# Patient Record
Sex: Female | Born: 2000 | Race: Black or African American | Hispanic: No | Marital: Single | Smoking: Never smoker
Health system: Southern US, Community
[De-identification: ages and names within clinical notes are randomized; demographics above are authoritative.]

## PROBLEM LIST (undated history)

## (undated) DIAGNOSIS — R519 Headache, unspecified: Secondary | ICD-10-CM

## (undated) DIAGNOSIS — R51 Headache: Secondary | ICD-10-CM

## (undated) HISTORY — DX: Headache, unspecified: R51.9

## (undated) HISTORY — DX: Headache: R51

---
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
## 2021-03-11 ENCOUNTER — Emergency Department (HOSPITAL_COMMUNITY)
Admission: EM | Admit: 2021-03-11 | Discharge: 2021-03-11 | Disposition: A | Discharge status: Home / Self Care | Payer: Medicaid Other | Attending: Emergency Medicine | Admitting: Emergency Medicine

## 2021-03-11 ENCOUNTER — Encounter (HOSPITAL_COMMUNITY): Payer: Self-pay

## 2021-03-11 ENCOUNTER — Other Ambulatory Visit: Payer: Self-pay

## 2021-03-11 DIAGNOSIS — M79642 Pain in left hand: Secondary | ICD-10-CM | POA: Diagnosis present

## 2021-03-11 DIAGNOSIS — R202 Paresthesia of skin: Secondary | ICD-10-CM | POA: Diagnosis not present

## 2021-03-11 NOTE — ED Triage Notes (Signed)
Pt c/o pain and tingling in left hand, states left middle finger feels hot. States she thinks she got bit by a spider because there was one in her car.

## 2021-03-11 NOTE — ED Provider Notes (Signed)
[facility] EMERGENCY DEPARTMENT Provider Note   CSN: [id number] Arrival date & time: 03/11/21  2208     History Chief Complaint  Patient presents with  . Hand Pain    Robin Yoder is a 20 y.o. female.  The history is provided by the patient.   Robin Yoder is a 20 y.o. female who presents to the Emergency Department complaining of left hand pain. She presents the emergency department for evaluation of discomfort in her left hand. She was driving down the road when she saw the spider. She went to kill the spider and is unclear if it bit her. She reports discomfort in a tingling sensation throughout her left hand. It feels slightly swollen. She feels more discomfort at the tip of her left third digit. She is right-hand dominant. No additional symptoms. No prior similar symptoms.    Past Medical History:  Diagnosis Date  . Headache     Patient Active Problem List   Diagnosis Date Noted  . Migraine without aura and without status migrainosus, not intractable 06/08/2015  . Episodic tension-type headache, not intractable 06/08/2015  . New daily persistent headache 06/08/2015    History reviewed. No pertinent surgical history.   OB History   No obstetric history on file.     Family History  Problem Relation Age of Onset  . Heart attack Maternal Grandfather        Died at 35 years    Social History   Tobacco Use  . Smoking status: Never Smoker  . Smokeless tobacco: Never Used  Substance Use Topics  . Alcohol use: No    Alcohol/week: 0.0 standard drinks  . Drug use: No    Home Medications Prior to Admission medications   Medication Sig Start Date End Date Taking? Authorizing Provider  ibuprofen (ADVIL,MOTRIN) 400 MG tablet Take 400 mg by mouth every 6 (six) hours as needed for headache. Take 1 tab q 4 to 6 hours PRN for headache.    [provider]    Allergies    Patient has no known allergies.  Review of Systems   Review of Systems   All other systems reviewed and are negative.   Physical Exam Updated Vital Signs BP (!) 163/105 (BP Location: Right Arm)   Pulse 79   Temp 98.6 F (37 C) (Oral)   Resp 18   SpO2 100%   Physical Exam Vitals and nursing note reviewed.  Constitutional:      Appearance: She is well-developed.  HENT:     Head: Normocephalic and atraumatic.  Cardiovascular:     Rate and Rhythm: Normal rate and regular rhythm.  Pulmonary:     Effort: Pulmonary effort is normal. No respiratory distress.  Abdominal:     Tenderness: There is no rebound.  Musculoskeletal:        General: No tenderness.     Comments: 2+ radial pulses bilaterally. There is no significant soft tissue swelling throughout the hand. Range of motion is intact throughout all the digits of the hand.  Skin:    General: Skin is warm and dry.  Neurological:     Mental Status: She is alert and oriented to person, place, and time.     Comments: Five/five grip strength in bilateral upper extremities. Sensationally touch intact and bilateral upper extremities.  Psychiatric:        Behavior: Behavior normal.     ED Results / Procedures / Treatments   Labs (all labs ordered are listed,  but only abnormal results are displayed) Labs Reviewed - No data to display  EKG None  Radiology No results found.  Procedures Procedures   Medications Ordered in ED Medications - No data to display  ED Course  I have reviewed the triage vital signs and the nursing notes.  Pertinent labs & imaging results that were available during my care of the patient were reviewed by me and considered in my medical decision making (see chart for details).    MDM Rules/Calculators/A&P                         patient here for evaluation paresthesias to the left hand in the setting of possible insect bite. There is no clear evidence of buydown examination and she is neurologically intact. She is well perfused. Presentation is not consistent with  CVA, dissection, life-threatening and animation. Discussed with patient homecare for paresthesias and hand discomfort. Return precautions discussed.  Final Clinical Impression(s) / ED Diagnoses Final diagnoses:  Pain of left hand    Rx / DC Orders ED Discharge Orders    None       Tilden Fossa, MD 03/11/21 2249
